# Patient Record
Sex: Female | Born: 1990 | Race: Black or African American | Hispanic: No | Marital: Single | Smoking: Never smoker
Health system: Southern US, Community
[De-identification: ages and names within clinical notes are randomized; demographics above are authoritative.]

## PROBLEM LIST (undated history)

## (undated) HISTORY — PX: WISDOM TOOTH EXTRACTION: SHX21

---

## 2010-05-23 ENCOUNTER — Emergency Department (HOSPITAL_COMMUNITY): Admission: EM | Admit: 2010-05-23 | Discharge: 2010-05-23 | Payer: Self-pay | Admitting: Family Medicine

## 2011-12-22 ENCOUNTER — Telehealth (HOSPITAL_COMMUNITY): Payer: Self-pay | Admitting: *Deleted

## 2011-12-22 ENCOUNTER — Encounter (HOSPITAL_COMMUNITY): Payer: Self-pay

## 2011-12-22 ENCOUNTER — Emergency Department (INDEPENDENT_AMBULATORY_CARE_PROVIDER_SITE_OTHER)
Admission: EM | Admit: 2011-12-22 | Discharge: 2011-12-22 | Disposition: A | Discharge status: Home / Self Care | Payer: BC Managed Care – PPO | Source: Home / Self Care | Attending: Family Medicine | Admitting: Family Medicine

## 2011-12-22 DIAGNOSIS — R0602 Shortness of breath: Secondary | ICD-10-CM

## 2011-12-22 DIAGNOSIS — J45901 Unspecified asthma with (acute) exacerbation: Secondary | ICD-10-CM

## 2011-12-22 DIAGNOSIS — R05 Cough: Secondary | ICD-10-CM

## 2011-12-22 DIAGNOSIS — R059 Cough, unspecified: Secondary | ICD-10-CM

## 2011-12-22 MED ORDER — METHYLPREDNISOLONE SODIUM SUCC 125 MG IJ SOLR
125.0000 mg | Freq: Once | INTRAMUSCULAR | Status: AC
  Administered 2011-12-22: 125 mg via INTRAMUSCULAR

## 2011-12-22 MED ORDER — ALBUTEROL SULFATE (5 MG/ML) 0.5% IN NEBU
INHALATION_SOLUTION | RESPIRATORY_TRACT | Status: AC
  Filled 2011-12-22: qty 1

## 2011-12-22 MED ORDER — IPRATROPIUM BROMIDE 0.02 % IN SOLN
0.5000 mg | Freq: Once | RESPIRATORY_TRACT | Status: AC
  Administered 2011-12-22: 0.5 mg via RESPIRATORY_TRACT

## 2011-12-22 MED ORDER — METHYLPREDNISOLONE SODIUM SUCC 125 MG IJ SOLR
INTRAMUSCULAR | Status: AC
  Filled 2011-12-22: qty 2

## 2011-12-22 MED ORDER — ALBUTEROL SULFATE (5 MG/ML) 0.5% IN NEBU: 5.0000 mg | INHALATION_SOLUTION | Freq: Four times a day (QID) | RESPIRATORY_TRACT | Status: DC | PRN

## 2011-12-22 MED ORDER — ALBUTEROL SULFATE (5 MG/ML) 0.5% IN NEBU
5.0000 mg | INHALATION_SOLUTION | Freq: Once | RESPIRATORY_TRACT | Status: AC
  Administered 2011-12-22: 5 mg via RESPIRATORY_TRACT

## 2011-12-22 NOTE — ED Notes (Signed)
Peak flow pre treatment was 170 and post 370

## 2011-12-22 NOTE — ED Provider Notes (Signed)
History     CSN: [id number]  Arrival date & time 12/22/11  1027   First MD Initiated Contact with Patient 12/22/11 1126      Chief Complaint  Patient presents with  . Asthma    (Consider location/radiation/quality/duration/timing/severity/associated sxs/prior treatment) Patient is a 21 y.o. female presenting with shortness of breath. The history is provided by the patient.  Shortness of Breath  The current episode started 2 days ago. The problem occurs continuously. The problem has been gradually worsening. The problem is moderate. The symptoms are relieved by nothing. Associated symptoms include cough, shortness of breath and wheezing. Her past medical history is significant for asthma.    Past Medical History  Diagnosis Date  . Asthma     History reviewed. No pertinent past surgical history.  No family history on file.  History  Substance Use Topics  . Smoking status: Never Smoker   . Smokeless tobacco: Not on file  . Alcohol Use: Yes    OB History    Grav Para Term Preterm Abortions TAB SAB Ect Mult Living                  Review of Systems  Constitutional: Negative.   HENT: Positive for postnasal drip.   Respiratory: Positive for cough, shortness of breath and wheezing.   Gastrointestinal: Negative.   Musculoskeletal: Negative.     Allergies  Review of patient's allergies indicates no known allergies.  Home Medications   Current Outpatient Rx  Name Route Sig Dispense Refill  . ALBUTEROL SULFATE HFA 108 (90 BASE) MCG/ACT IN AERS Inhalation Inhale 2 puffs into the lungs every 6 (six) hours as needed.    . ALBUTEROL SULFATE (5 MG/ML) 0.5% IN NEBU Nebulization Take 1 mL (5 mg total) by nebulization every 6 (six) hours as needed for wheezing. 20 mL 2    BP 133/100  Pulse 84  Temp(Src) 98.2 F (36.8 C) (Oral)  Resp 21  SpO2 98%  LMP 12/20/2011  Physical Exam  Nursing note and vitals reviewed. Constitutional: She is oriented to person, place, and  time. She appears well-developed and well-nourished.  HENT:  Head: Normocephalic.  Right Ear: External ear normal.  Left Ear: External ear normal.  Nose: Nose normal.  Mouth/Throat: Oropharynx is clear and moist.  Eyes: Conjunctivae are normal. Pupils are equal, round, and reactive to light.  Neck: Normal range of motion. Neck supple.  Pulmonary/Chest: No accessory muscle usage. Tachypnea noted. No respiratory distress. She has wheezes in the right upper field, the right middle field, the right lower field, the left upper field, the left middle field and the left lower field. She has no rales.  Abdominal: Soft. Bowel sounds are normal.  Lymphadenopathy:    She has no cervical adenopathy.  Neurological: She is alert and oriented to person, place, and time.  Skin: Skin is warm and dry.    ED Course  Procedures (including critical care time)  Labs Reviewed - No data to display No results found.   1. Asthma exacerbation attacks       MDM  Pre-peak flow 170., post neb was 325, sx much improved., lungs min wheeze, improved at d/c.        Barkley Bruns, MD 12/22/11 [phone]

## 2011-12-22 NOTE — ED Notes (Signed)
Patient turned nebulizer upside down and spilled 1/2 of medication on floor.  Rest of solution wasted and another set-up mixed.

## 2011-12-22 NOTE — ED Notes (Signed)
C/o having frequent asthma attacks since 12/20/11.  States having wheezing and SOB.

## 2015-08-29 ENCOUNTER — Emergency Department (HOSPITAL_COMMUNITY)
Admission: EM | Admit: 2015-08-29 | Discharge: 2015-08-30 | Disposition: A | Discharge status: Home / Self Care | Payer: BC Managed Care – PPO | Attending: Emergency Medicine | Admitting: Emergency Medicine

## 2015-08-29 ENCOUNTER — Encounter (HOSPITAL_COMMUNITY): Payer: Self-pay | Admitting: Emergency Medicine

## 2015-08-29 ENCOUNTER — Emergency Department (HOSPITAL_COMMUNITY): Payer: BC Managed Care – PPO

## 2015-08-29 DIAGNOSIS — J45901 Unspecified asthma with (acute) exacerbation: Secondary | ICD-10-CM | POA: Insufficient documentation

## 2015-08-29 DIAGNOSIS — Z79899 Other long term (current) drug therapy: Secondary | ICD-10-CM | POA: Insufficient documentation

## 2015-08-29 MED ORDER — ALBUTEROL SULFATE HFA 108 (90 BASE) MCG/ACT IN AERS: 1.0000 | INHALATION_SPRAY | Freq: Four times a day (QID) | RESPIRATORY_TRACT | Status: AC | PRN

## 2015-08-29 MED ORDER — PREDNISONE 10 MG PO TABS: 20.0000 mg | ORAL_TABLET | Freq: Every day | ORAL | Status: DC

## 2015-08-29 MED ORDER — FLUTICASONE PROPIONATE 50 MCG/ACT NA SUSP: 1.0000 | Freq: Every day | NASAL | Status: AC

## 2015-08-29 MED ORDER — IPRATROPIUM-ALBUTEROL 0.5-2.5 (3) MG/3ML IN SOLN
3.0000 mL | Freq: Once | RESPIRATORY_TRACT | Status: AC
  Administered 2015-08-29: 3 mL via RESPIRATORY_TRACT

## 2015-08-29 MED ORDER — IPRATROPIUM-ALBUTEROL 0.5-2.5 (3) MG/3ML IN SOLN
RESPIRATORY_TRACT | Status: AC
  Filled 2015-08-29: qty 3

## 2015-08-29 NOTE — ED Provider Notes (Signed)
CSN: [id number]     Arrival date & time 08/29/15  2113 History   First MD Initiated Contact with Patient 08/29/15 2249     Chief Complaint  Patient presents with  . Asthma    [name] is a 24 yo F PMH asthma presenting with a 3 day history of SOB that has gotten progressively worse. She admits to fevers, nasal congestion, rhinorrhea, and left ear pain. She states her SOB is similar to her asthma symptoms in the past. Robitussin has not alleviated her URI symptoms. She admits to being noncompliant with asthma medications, and has not seen a PCP in "years." She denies CP, palpitations, edema.   HPI  Past Medical History  Diagnosis Date  . Asthma    History reviewed. No pertinent past surgical history. No family history on file. Social History  Substance Use Topics  . Smoking status: Never Smoker   . Smokeless tobacco: None  . Alcohol Use: Yes   OB History    No data available     Review of Systems  Constitutional: Positive for fever.  HENT: Positive for congestion and ear pain. Negative for ear discharge and sore throat.   Eyes: Negative for pain and discharge.  Respiratory: Positive for cough, shortness of breath and wheezing.   Gastrointestinal: Negative for nausea, vomiting and abdominal pain.      Allergies  Review of patient's allergies indicates no known allergies.  Home Medications   Prior to Admission medications   Medication Sig Start Date End Date Taking? Authorizing Provider  albuterol (PROVENTIL HFA;VENTOLIN HFA) 108 (90 BASE) MCG/ACT inhaler Inhale 2 puffs into the lungs every 6 (six) hours as needed.    Historical Provider, MD  albuterol (PROVENTIL) (5 MG/ML) 0.5% nebulizer solution Take 1 mL (5 mg total) by nebulization every 6 (six) hours as needed for wheezing. 12/22/11 12/21/12  Linna HoffJames D Kindl, MD   BP 142/92 mmHg  Pulse 117  Temp(Src) 100 F (37.8 C) (Oral)  Resp 20  SpO2 99%  LMP 07/30/2015 (Approximate) Physical Exam  Constitutional: She appears  well-developed and well-nourished. No distress.  HENT:  Head: Normocephalic and atraumatic.  Right Ear: External ear normal.  Left Ear: External ear normal.  Nose: Nose normal.  Mouth/Throat: Oropharynx is clear and moist. No oropharyngeal exudate.  Eyes: Conjunctivae are normal. Pupils are equal, round, and reactive to light. Right eye exhibits no discharge. Left eye exhibits no discharge. No scleral icterus.  Neck: No tracheal deviation present.  Cardiovascular: Normal rate, regular rhythm, normal heart sounds and intact distal pulses.  Exam reveals no gallop and no friction rub.   No murmur heard. Pulmonary/Chest: Effort normal. No respiratory distress. She has wheezes. She has no rales. She exhibits no tenderness.  Abdominal: Soft. Bowel sounds are normal. She exhibits no distension and no mass. There is no tenderness. There is no rebound and no guarding.  Musculoskeletal: Normal range of motion. She exhibits no edema.  Lymphadenopathy:    She has no cervical adenopathy.  Neurological: She is alert. Coordination normal.  Skin: Skin is warm and dry. No rash noted. She is not diaphoretic. No erythema.  Psychiatric: She has a normal mood and affect. Her behavior is normal.  Nursing note and vitals reviewed.   ED Course  Procedures (including critical care time) Labs Review Labs Reviewed - No data to display  Imaging Review Dg Chest 2 View  08/29/2015  CLINICAL DATA:  Shortness of breath and cough for 1 day. EXAM: CHEST  2 VIEW COMPARISON:  None. FINDINGS: The heart size and mediastinal contours are within normal limits. Both lungs are clear. The visualized skeletal structures are unremarkable. IMPRESSION: No active cardiopulmonary disease. Electronically SiSherian Rein Wei-Chen  Lin M.D.   On: 08/29/2015 21:52   I have personally reviewed and evaluated these images and lab results as part of my medical decision-making.   EKG Interpretation None      MDM   Final diagnoses:   None     CXR unremarkable.  Respiratory symptoms improved on duoneb.   1. Asthma exacerbation  - Discharge with albuterol, prednisone, and flonase    Melton Krebs, PA-C 08/29/15 2333  Eber Hong, MD 08/30/15 0040

## 2015-08-29 NOTE — ED Provider Notes (Signed)
The patient is 24 year old, she has a history of asthma, she has not had an inhaler in 2 years, has not had prednisone in over 12 months. She presents with coughing, wheezing, runny nose. On exam she has mild clear rhinorrhea, nasal sounding voice, clear oropharynx, moist mucous membranes, diffuse expiratory wheezing with forced expiration but no respiratory distress, normal oxygenation, normal lung sounds otherwise. No peripheral edema, I'll tachycardia to 105 after receiving albuterol treatment. X-ray reviewed, shows no signs of infiltrate or pneumothorax, patient stable for discharge on metered-dose inhaler, prescription given, Flonase, prescription given, prednisone, prescription given.  Can follow up outpatient, patient is agreeable to the plan.  Medical screening examination/treatment/procedure(s) were conducted as a shared visit with non-physician practitioner(s) and myself.  I personally evaluated the patient during the encounter.  Clinical Impression:   Final diagnoses:  Asthma exacerbation         Eber HongBrian Yisroel Mullendore, MD 08/30/15 [phone]

## 2015-08-29 NOTE — Discharge Instructions (Signed)
Asthma, Adult °Asthma is a condition of the lungs in which the airways tighten and narrow. Asthma can make it hard to breathe. Asthma cannot be cured, but medicine and lifestyle changes can help control it. Asthma may be started (triggered) by: °· Animal skin flakes (dander). °· Dust. °· Cockroaches. °· Pollen. °· Mold. °· Smoke. °· Cleaning products. °· Hair sprays or aerosol sprays. °· Paint fumes or strong smells. °· Cold air, weather changes, and winds. °· Crying or laughing hard. °· Stress. °· Certain medicines or drugs. °· Foods, such as dried fruit, potato chips, and sparkling grape juice. °· Infections or conditions (colds, flu). °· Exercise. °· Certain medical conditions or diseases. °· Exercise or tiring activities. °HOME CARE  °· Take medicine as told by your doctor. °· Use a peak flow meter as told by your doctor. A peak flow meter is a tool that measures how well the lungs are working. °· Record and keep track of the peak flow meter's readings. °· Understand and use the asthma action plan. An asthma action plan is a written plan for taking care of your asthma and treating your attacks. °· To help prevent asthma attacks: °· Do not smoke. Stay away from secondhand smoke. °· Change your heating and air conditioning filter often. °· Limit your use of fireplaces and wood stoves. °· Get rid of pests (such as roaches and mice) and their droppings. °· Throw away plants if you see mold on them. °· Clean your floors. Dust regularly. Use cleaning products that do not smell. °· Have someone vacuum when you are not home. Use a vacuum cleaner with a HEPA filter if possible. °· Replace carpet with wood, tile, or vinyl flooring. Carpet can trap animal skin flakes and dust. °· Use allergy-proof pillows, mattress covers, and box spring covers. °· Wash bed sheets and blankets every week in hot water and dry them in a dryer. °· Use blankets that are made of polyester or cotton. °· Clean bathrooms and kitchens with bleach.  If possible, have someone repaint the walls in these rooms with mold-resistant paint. Keep out of the rooms that are being cleaned and painted. °· Wash hands often. °GET HELP IF: °· You have make a whistling sound when breaking (wheeze), have shortness of breath, or have a cough even if taking medicine to prevent attacks. °· The colored mucus you cough up (sputum) is thicker than usual. °· The colored mucus you cough up changes from clear or white to yellow, green, gray, or bloody. °· You have problems from the medicine you are taking such as: °· A rash. °· Itching. °· Swelling. °· Trouble breathing. °· You need reliever medicines more than 2-3 times a week. °· Your peak flow measurement is still at 50-79% of your personal best after following the action plan for 1 hour. °· You have a fever. °GET HELP RIGHT AWAY IF:  °· You seem to be worse and are not responding to medicine during an asthma attack. °· You are short of breath even at rest. °· You get short of breath when doing very little activity. °· You have trouble eating, drinking, or talking. °· You have chest pain. °· You have a fast heartbeat. °· Your lips or fingernails start to turn blue. °· You are light-headed, dizzy, or faint. °· Your peak flow is less than 50% of your personal best. °  °This information is not intended to replace advice given to you by your health care provider. Make sure   you discuss any questions you have with your health care provider.   Document Released: 04/20/2008 Document Revised: 07/24/2015 Document Reviewed: 06/01/2013 Elsevier Interactive Patient Education 2016 [place] Prevention While you may not be able to control the fact that you have asthma, you can take actions to prevent asthma attacks. The best way to prevent asthma attacks is to maintain good control of your asthma. You can achieve this by:  Taking your medicines as directed.  Avoiding things that can irritate your airways or make your  asthma symptoms worse (asthma triggers).  Keeping track of how well your asthma is controlled and of any changes in your symptoms.  Responding quickly to worsening asthma symptoms (asthma attack).  Seeking emergency care when it is needed. WHAT ARE SOME WAYS TO PREVENT AN ASTHMA ATTACK? Have a Plan Work with your health care provider to create a written plan for managing and treating your asthma attacks (asthma action plan). This plan includes:  A list of your asthma triggers and how you can avoid them.  Information on when medicines should be taken and when their dosages should be changed.  The use of a device that measures how well your lungs are working (peak flow meter). Monitor Your Asthma Use your peak flow meter and record your results in a journal every day. A drop in your peak flow numbers on one or more days may indicate the start of an asthma attack. This can happen even before you start to feel symptoms. You can prevent an asthma attack from getting worse by following the steps in your asthma action plan. Avoid Asthma Triggers Work with your asthma health care provider to find out what your asthma triggers are. This can be done by:  Allergy testing.  Keeping a journal that notes when asthma attacks occur and the factors that may have contributed to them.  Determining if there are other medical conditions that are making your asthma worse. Once you have determined your asthma triggers, take steps to avoid them. This may include avoiding excessive or prolonged exposure to:  Dust. Have someone dust and vacuum your home for you once or twice a week. Using a high-efficiency particulate arrestance (HEPA) vacuum is best.  Smoke. This includes campfire smoke, forest fire smoke, and secondhand smoke from tobacco products.  Pet dander. Avoid contact with animals that you know you are allergic to.  Allergens from trees, grasses or pollens. Avoid spending a lot of time outdoors when  pollen counts are high, and on very windy days.  Very cold, dry, or humid air.  Mold.  Foods that contain high amounts of sulfites.  Strong odors.  Outdoor air pollutants, such as Lexicographer.  Indoor air pollutants, such as aerosol sprays and fumes from household cleaners.  Household pests, including dust mites and cockroaches, and pest droppings.  Certain medicines, including NSAIDs. Always talk to your health care provider before stopping or starting any new medicines. Medicines Take over-the-counter and prescription medicines only as told by your health care provider. Many asthma attacks can be prevented by carefully following your medicine schedule. Taking your medicines correctly is especially important when you cannot avoid certain asthma triggers. Act Quickly If an asthma attack does happen, acting quickly can decrease how severe it is and how long it lasts. Take these steps:   Pay attention to your symptoms. If you are coughing, wheezing, or having difficulty breathing, do not wait to see if your symptoms go away on their  own. Follow your asthma action plan.  If you have followed your asthma action plan and your symptoms are not improving, call your health care provider or seek immediate medical care at the nearest hospital. It is important to note how often you need to use your fast-acting rescue inhaler. If you are using your rescue inhaler more often, it may mean that your asthma is not under control. Adjusting your asthma treatment plan may help you to prevent future asthma attacks and help you to gain better control of your condition. HOW CAN I PREVENT AN ASTHMA ATTACK WHEN I EXERCISE? Follow advice from your health care provider about whether you should use your fast-acting inhaler before exercising. Many people with asthma experience exercise-induced bronchoconstriction (EIB). This condition often worsens during vigorous exercise in cold, humid, or dry environments.  Usually, people with EIB can stay very active by pre-treating with a fast-acting inhaler before exercising.   This information is not intended to replace advice given to you by your health care provider. Make sure you discuss any questions you have with your health care provider.   Document Released: 10/21/2009 Document Revised: 07/24/2015 Document Reviewed: 04/04/2015 Elsevier Interactive Patient Education [employer]2016 [employer] Inc[employer]

## 2015-08-29 NOTE — ED Notes (Signed)
Patient with history of asthma, states she has been having shortness of breath all day long.  Patient with audible wheezing in triage.  Patient states she has had an URI for the last two days.  Patient does have a cough, productive, green-yellow in nature.

## 2016-11-21 IMAGING — CR DG CHEST 2V
2 series · 2 of 2 positions shown · non-contrast
Comparison: None.

CLINICAL DATA: Shortness of breath and cough for 1 day.

EXAM:
CHEST  2 VIEW

[chest pa]
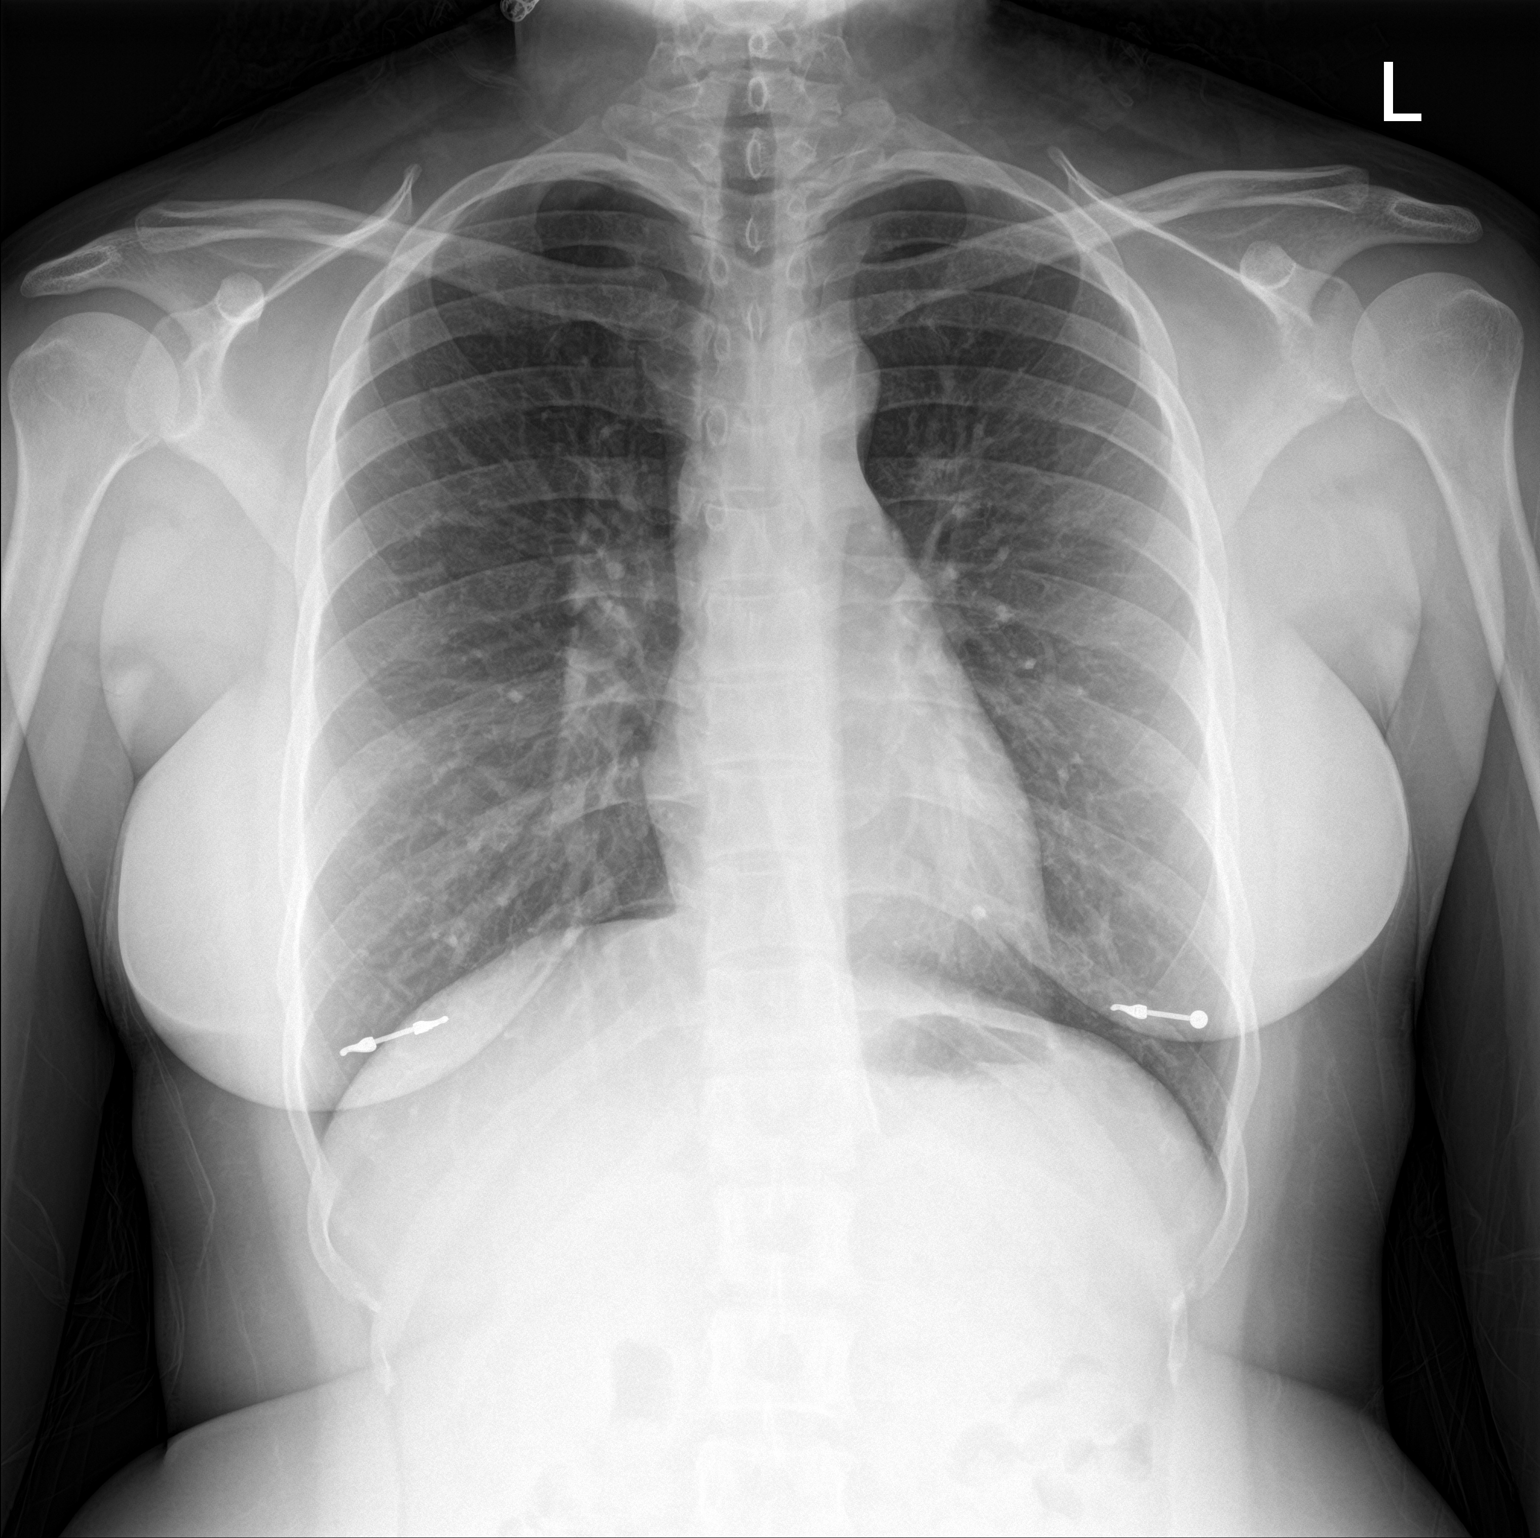

[chest lat]
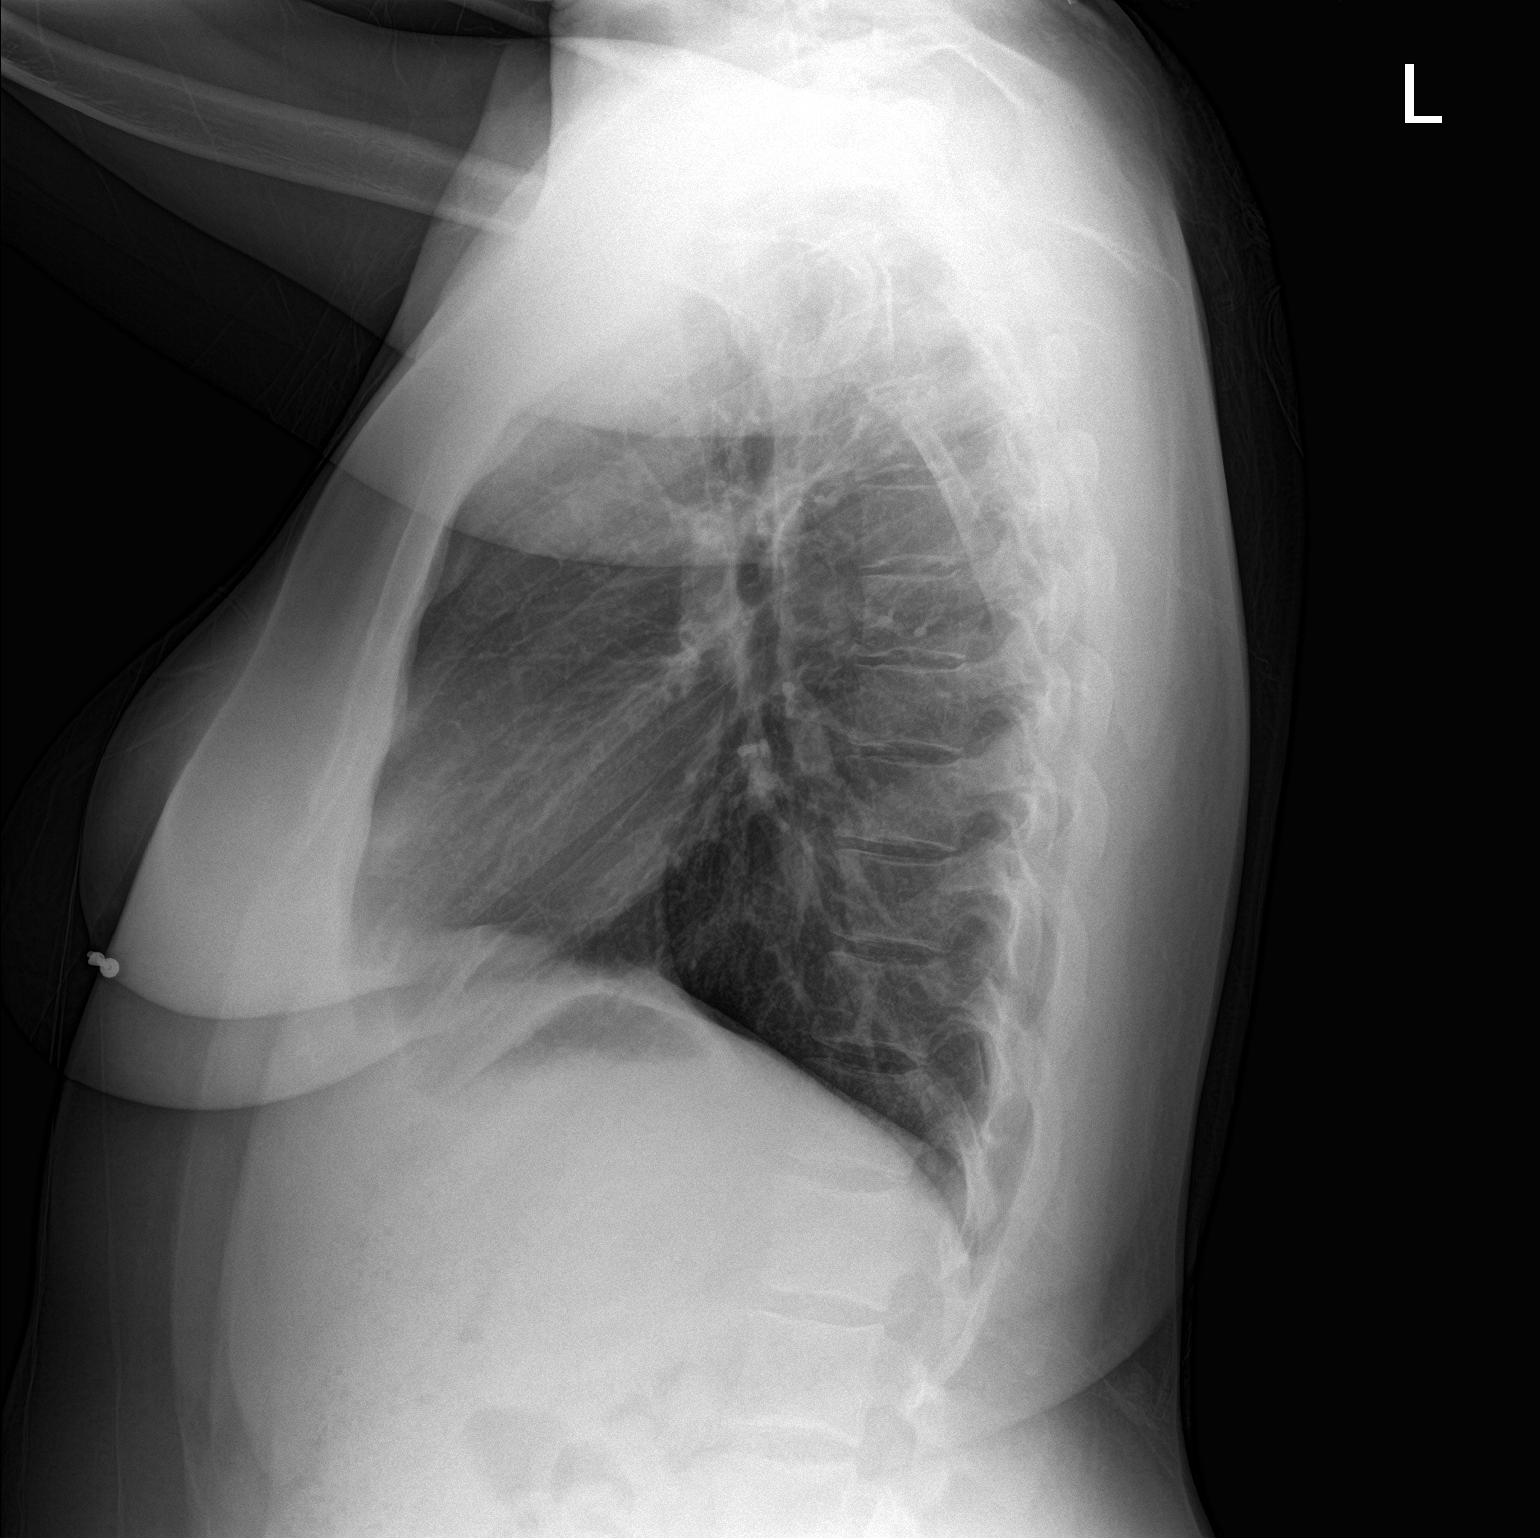

[2 of 2 positions shown; findings below may reference images not displayed]

FINDINGS: The heart size and mediastinal contours are within normal limits.
Both lungs are clear. The visualized skeletal structures are
unremarkable.
IMPRESSION: No active cardiopulmonary disease.

## 2016-11-23 ENCOUNTER — Other Ambulatory Visit: Payer: Self-pay | Admitting: Physician Assistant

## 2016-11-23 ENCOUNTER — Other Ambulatory Visit (HOSPITAL_COMMUNITY)
Admission: RE | Admit: 2016-11-23 | Discharge: 2016-11-23 | Disposition: A | Discharge status: Home / Self Care | Payer: BLUE CROSS/BLUE SHIELD | Source: Ambulatory Visit | Attending: Physician Assistant | Admitting: Physician Assistant

## 2016-11-23 DIAGNOSIS — Z113 Encounter for screening for infections with a predominantly sexual mode of transmission: Secondary | ICD-10-CM | POA: Insufficient documentation

## 2016-11-23 DIAGNOSIS — Z124 Encounter for screening for malignant neoplasm of cervix: Secondary | ICD-10-CM | POA: Diagnosis present

## 2016-11-23 DIAGNOSIS — Z1151 Encounter for screening for human papillomavirus (HPV): Secondary | ICD-10-CM | POA: Insufficient documentation

## 2016-11-25 LAB — CYTOLOGY - PAP
CHLAMYDIA, DNA PROBE: NEGATIVE
Diagnosis: NEGATIVE
HPV (WINDOPATH): NOT DETECTED
Neisseria Gonorrhea: NEGATIVE

## 2016-12-18 ENCOUNTER — Encounter (INDEPENDENT_AMBULATORY_CARE_PROVIDER_SITE_OTHER): Payer: Self-pay

## 2016-12-18 ENCOUNTER — Ambulatory Visit (INDEPENDENT_AMBULATORY_CARE_PROVIDER_SITE_OTHER): Payer: BLUE CROSS/BLUE SHIELD | Admitting: Allergy

## 2016-12-18 ENCOUNTER — Encounter: Payer: Self-pay | Admitting: Allergy

## 2016-12-18 VITALS — BP 118/72 | HR 71 | Temp 98.2°F | Ht 62.0 in | Wt 194.2 lb

## 2016-12-18 DIAGNOSIS — H1010 Acute atopic conjunctivitis, unspecified eye: Secondary | ICD-10-CM

## 2016-12-18 DIAGNOSIS — J4540 Moderate persistent asthma, uncomplicated: Secondary | ICD-10-CM | POA: Diagnosis not present

## 2016-12-18 DIAGNOSIS — J309 Allergic rhinitis, unspecified: Secondary | ICD-10-CM | POA: Diagnosis not present

## 2016-12-18 DIAGNOSIS — Z91018 Allergy to other foods: Secondary | ICD-10-CM

## 2016-12-18 MED ORDER — MONTELUKAST SODIUM 10 MG PO TABS: 10.0000 mg | ORAL_TABLET | Freq: Every day | ORAL | 6 refills | Status: AC

## 2016-12-18 MED ORDER — EPINEPHRINE 0.3 MG/0.3ML IJ SOAJ: 0.3000 mg | Freq: Once | INTRAMUSCULAR | 0 refills | Status: AC

## 2016-12-18 NOTE — Progress Notes (Signed)
New Patient Note  RE: Mariah Warner MRN: [id number] DOB: 14-Jan-1991 Date of Office Visit: 12/18/2016  Referring provider: No ref. provider found Primary care provider: Marcelo BaldyMAUNEY, JESSICA S, PA-C  Chief Complaint: Allergies and allergy testing  History of present illness: Mariah ReiningDevin Warner is a 26 y.o. female presenting today for evaluation of allergies and she is wanting to have allergy testing done.  She has a history of environmental allergies and had testing done as a child remembers being positive to pollens, molds, pets. She recalls having had allergy injections as a child and believes she did about 3-5 years before stopping. She reports her current symptoms are itchy eyes, runny nose, wheezing and sneezing. The symptoms are worse in the spring.  She gets hives with dog saliva contact to skin.  She used to take Allegra.  She is taking zyrtec now as needed.  She uses fluticasone nasal spray 1 spray each side.    She is also shellfish allergic.  She states when she goes [place] a [place] she sometimes starts to wheeze.  She has been allergic since she was a young child but does not recall her initial reaction or a PA at diagnosis. She is able to eat fish.    She has a history of asthma diagnosis in childhood.  She has been hospitalized multiple times with last being 1years ago.  She has never required intubation. She just started on Flovent in the beginning of the year.  She takes 1 puff twice day.  Before she started on Flovent she was using albuterol multiple times a day.  Since being Flovent she has used albuterol 2-3 times.  No oral steroid use in the past year.  She denies any nighttime awakenings now.   No history of eczema.   Review of systems: Review of Systems  Constitutional: Negative for chills, fever and malaise/fatigue.  HENT: Positive for congestion. Negative for ear pain, nosebleeds, sinus pain and sore throat.   Eyes: Negative for discharge and redness.  Respiratory:  Positive for cough, shortness of breath and wheezing.   Cardiovascular: Negative for chest pain.  Gastrointestinal: Negative for heartburn, nausea and vomiting.  Skin: Negative for itching and rash.    All other systems negative unless noted above in HPI  Past medical history: Past Medical History:  Diagnosis Date  . Asthma     Past surgical history: Past Surgical History:  Procedure Laterality Date  . WISDOM TOOTH EXTRACTION      Family history:  Family History  Problem Relation Age of Onset  . Allergic rhinitis Mother   . Asthma Mother   . Allergic rhinitis Father   . Asthma Father   . Allergic rhinitis Brother   . Asthma Brother   . Angioedema Neg Hx   . Eczema Neg Hx   . Immunodeficiency Neg Hx   . Urticaria Neg Hx   . Atopy Neg Hx     Social history: She lives in an apartment with carpeting with electric heating and central cooling. There are no pets in the home. There is no concern for water damage or mildew roaches in the home. She has no smoking history. She is an Media plannerathletic coordinator.   Medication List: Allergies as of 12/18/2016   No Known Allergies     Medication List       Accurate as of 12/18/16  1:29 PM. Always use your most recent med list.          albuterol 108 (90  Base) MCG/ACT inhaler Commonly known as:  PROVENTIL HFA;VENTOLIN HFA Inhale 1-2 puffs into the lungs every 6 (six) hours as needed for wheezing or shortness of breath.   EPINEPHrine 0.3 mg/0.3 mL Soaj injection Commonly known as:  AUVI-Q Inject 0.3 mLs (0.3 mg total) into the muscle once.   FLOVENT HFA 110 MCG/ACT inhaler Generic drug:  fluticasone   fluticasone 50 MCG/ACT nasal spray Commonly known as:  FLONASE Place 1 spray into both nostrils daily.   montelukast 10 MG tablet Commonly known as:  SINGULAIR Take 1 tablet (10 mg total) by mouth at bedtime.   SUMAtriptan 50 MG tablet Commonly known as:  IMITREX       Known medication allergies: No Known  Allergies   Physical examination: Blood pressure 118/72, pulse 71, temperature 98.2 F (36.8 C), temperature source Oral, height 5\' 2"  (1.575 m), weight 194 lb 3.2 oz (88.1 kg), SpO2 99 %.  General: Alert, interactive, in no acute distress. HEENT: TMs pearly gray, turbinates mildly edematous without discharge, post-pharynx non erythematous. Neck: Supple without lymphadenopathy. Lungs: Clear to auscultation without wheezing, rhonchi or rales. {no increased work of breathing. CV: Normal S1, S2 without murmurs. Abdomen: Nondistended, nontender. Skin: Warm and dry, without lesions or rashes. Extremities:  No clubbing, cyanosis or edema. Neuro:   Grossly intact.  Diagnositics/Labs:  Spirometry: FEV1: 2.36L  96%, FVC: 3.54L  125%, ratio consistent with Mild obstructive pattern  Allergy testing: Skin prick testing positive for grasses, weeds, trees, molds mix 4, epicoccum, dust mite, cat, horse, cockroach, mouse.  Intradermal testing was positive for mold mix 2, mold mix 3, dog Allergy testing results were read and interpreted by provider, documented by clinical staff.   Assessment and plan:  Allergic rhinoconjunctivitis  - Continue Zyrtec 10 mg as needed  - Continue fluticasone 1-2 sprays each nostril daily as needed. Use 1-2 weeks at a time before stopping of symptoms improve  - Start Singulair 10 mg daily  - Nasal saline wash daily as needed  Moderate persistent asthma  - Continue Flovent 1 puff twice a day.  Increase to 2 puffs twice a day during asthma flare and illnesses.  - Singulair as above  - Use albuterol inhaler 2 puffs every 4 hours as needed for cough, wheeze, chest tightness or shortness of breath.   Monitor frequency of use  Asthma control goals:   Full participation in all desired activities (may need albuterol before activity)  Albuterol use two time or less a week on average (not counting use with activity)  Cough interfering with sleep two time or less a  month  Oral steroids no more than once a year  No hospitalizations  Food allergy  - Continue avoidance of shellfish  - Have access to self injectable epinephrine device 0.3mg  for as needed use in case of allergic reaction  - Follow  food action plan in case of allergic reaction  Follow-up in 4-6 months  I appreciate the opportunity to take part in Jaala's care. Please do not hesitate to contact me with questions.  Sincerely,   Margo Aye, MD Allergy/Immunology [facility]

## 2016-12-18 NOTE — Patient Instructions (Addendum)
Environmental allergy testing grasses, weeds, trees, molds, dust mite, cat, dog, horse, cockroach, molds  Shellfish allergy testing positive for shrimp, crab, oyster, lobster, scallops  For control of allergy symptoms:  - Continue Zyrtec 10 mg as needed  - Continue fluticasone 1-2 sprays each nostril daily as needed. Use 1-2 weeks at a time before stopping of symptoms improve  - Start Singulair 10 mg daily  - Nasal saline wash daily as needed  For control of asthma symptoms:  - Continue Flovent 1 puff twice a day.  Increase to 2 puffs twice a day during asthma flare and illnesses.  - Singulair as above  - Use albuterol inhaler 2 puffs every 4 hours as needed for cough, wheeze, chest tightness or shortness of breath.   Monitor frequency of use  Asthma control goals:   Full participation in all desired activities (may need albuterol before activity)  Albuterol use two time or less a week on average (not counting use with activity)  Cough interfering with sleep two time or less a month  Oral steroids no more than once a year  No hospitalizations  For food allergy:  - Continue avoidance of shellfish  - Have access to self injectable epinephrine device 0.3mg  for as needed use in case of allergic reaction  - Follow  food action plan in case of allergic reaction  Follow-up in 4-6 months

## 2016-12-22 ENCOUNTER — Ambulatory Visit: Payer: Self-pay | Admitting: Allergy and Immunology

## 2017-01-08 ENCOUNTER — Encounter: Payer: Self-pay | Admitting: *Deleted
